# Patient Record
Sex: Female | Born: 1945 | Race: White | Hispanic: No | Marital: Married | Smoking: Former smoker
Health system: Southern US, Community
[De-identification: ages and names within clinical notes are randomized; demographics above are authoritative.]

## PROBLEM LIST (undated history)

## (undated) DIAGNOSIS — I10 Essential (primary) hypertension: Secondary | ICD-10-CM

## (undated) HISTORY — DX: Essential (primary) hypertension: I10

---

## 1995-04-10 HISTORY — PX: ABDOMINAL HYSTERECTOMY: SHX81

## 1998-11-15 ENCOUNTER — Other Ambulatory Visit: Admission: RE | Admit: 1998-11-15 | Discharge: 1998-11-15 | Payer: Self-pay | Admitting: Obstetrics and Gynecology

## 1998-12-01 ENCOUNTER — Ambulatory Visit (HOSPITAL_COMMUNITY): Admission: RE | Admit: 1998-12-01 | Discharge: 1998-12-01 | Payer: Self-pay | Admitting: Obstetrics and Gynecology

## 1998-12-01 ENCOUNTER — Encounter: Payer: Self-pay | Admitting: Obstetrics and Gynecology

## 1998-12-16 ENCOUNTER — Encounter (INDEPENDENT_AMBULATORY_CARE_PROVIDER_SITE_OTHER): Payer: Self-pay | Admitting: Specialist

## 1998-12-16 ENCOUNTER — Ambulatory Visit (HOSPITAL_COMMUNITY): Admission: RE | Admit: 1998-12-16 | Discharge: 1998-12-16 | Payer: Self-pay | Admitting: Gastroenterology

## 2002-06-29 ENCOUNTER — Encounter (INDEPENDENT_AMBULATORY_CARE_PROVIDER_SITE_OTHER): Payer: Self-pay

## 2002-06-29 ENCOUNTER — Ambulatory Visit (HOSPITAL_COMMUNITY): Admission: RE | Admit: 2002-06-29 | Discharge: 2002-06-29 | Payer: Self-pay | Admitting: Gastroenterology

## 2003-09-17 ENCOUNTER — Ambulatory Visit (HOSPITAL_COMMUNITY): Admission: RE | Admit: 2003-09-17 | Discharge: 2003-09-17 | Payer: Self-pay | Admitting: Family Medicine

## 2009-03-04 ENCOUNTER — Encounter: Admission: RE | Admit: 2009-03-04 | Discharge: 2009-03-04 | Payer: Self-pay | Admitting: Internal Medicine

## 2009-04-05 ENCOUNTER — Other Ambulatory Visit: Admission: RE | Admit: 2009-04-05 | Discharge: 2009-04-05 | Payer: Self-pay | Admitting: Obstetrics and Gynecology

## 2010-03-21 ENCOUNTER — Other Ambulatory Visit
Admission: RE | Admit: 2010-03-21 | Discharge: 2010-03-21 | Payer: Self-pay | Source: Home / Self Care | Admitting: Obstetrics and Gynecology

## 2010-08-25 NOTE — Op Note (Signed)
   NAMEJALAYSIA, Courtney Kaiser                      ACCOUNT NO.:  [id number]   MEDICAL RECORD NO.:  [id number]                   PATIENT TYPE:  AMB   LOCATION:  ENDO                                 FACILITY:  [facility]   PHYSICIAN:  John C. Madilyn Fireman, M.D.                 DATE OF BIRTH:  1945/04/22   DATE OF PROCEDURE:  06/29/2002  DATE OF DISCHARGE:                                 OPERATIVE REPORT   PROCEDURE:  Colonoscopy with polypectomy.   INDICATION FOR PROCEDURE:  Adenomatous colon polyps on initial colonoscopy  three years ago.   DESCRIPTION OF PROCEDURE:  The patient was placed in the left lateral  decubitus position and placed on the pulse monitor with continuous low-flow  oxygen delivered by nasal cannula.  She was sedated with 100 mcg IV fentanyl  and 9 mg IV Versed.  The Olympus video colonoscope was inserted into the  rectum and advanced to the cecum, confirmed by transillumination of  McBurney's point and visualization of the ileocecal valve and appendiceal  orifice.  The prep was excellent.  The cecum, ascending, transverse,  descending, and sigmoid colon all appeared normal with no masses, polyps,  diverticula, or other mucosal abnormalities.  The rectum revealed two small  sessile polyps 6-8 mm in diameter near the rectosigmoid junction, and these  were fulgurated by hot biopsy.  The remainder of the rectum appeared normal  down to the anus, where retroflexed view revealed small internal  hemorrhoids.  The colonoscope was then withdrawn and the patient returned to  the recovery room in stable condition.  She tolerated the procedure well,  and there were no immediate complications.   IMPRESSION:  1. Rectosigmoid colon polyps.  2. Small internal hemorrhoids.   PLAN:  Await histology, and will probably repeat colonoscopy in five years.                                               John C. Madilyn Fireman, M.D.    JCH/MEDQ  D:  06/29/2002  T:  06/29/2002  Job:  [id number]   cc:    Joycelyn Rua, M.D.  [address] [place]  [place] [address]  Fax: [phone]

## 2011-07-09 ENCOUNTER — Other Ambulatory Visit: Payer: Self-pay | Admitting: Advanced Practice Midwife

## 2011-07-09 DIAGNOSIS — Z1231 Encounter for screening mammogram for malignant neoplasm of breast: Secondary | ICD-10-CM

## 2011-07-16 ENCOUNTER — Ambulatory Visit
Admission: RE | Admit: 2011-07-16 | Discharge: 2011-07-16 | Disposition: A | Discharge status: Home / Self Care | Payer: BC Managed Care – PPO | Source: Ambulatory Visit | Attending: Advanced Practice Midwife | Admitting: Advanced Practice Midwife

## 2011-07-16 DIAGNOSIS — Z1231 Encounter for screening mammogram for malignant neoplasm of breast: Secondary | ICD-10-CM

## 2011-11-08 ENCOUNTER — Other Ambulatory Visit: Payer: Self-pay | Admitting: Family Medicine

## 2011-11-08 ENCOUNTER — Other Ambulatory Visit (HOSPITAL_COMMUNITY)
Admission: RE | Admit: 2011-11-08 | Discharge: 2011-11-08 | Disposition: A | Discharge status: Home / Self Care | Payer: BC Managed Care – PPO | Source: Ambulatory Visit | Attending: Family Medicine | Admitting: Family Medicine

## 2011-11-08 DIAGNOSIS — Z124 Encounter for screening for malignant neoplasm of cervix: Secondary | ICD-10-CM | POA: Insufficient documentation

## 2012-11-14 ENCOUNTER — Other Ambulatory Visit: Payer: Self-pay | Admitting: Family Medicine

## 2012-11-14 ENCOUNTER — Other Ambulatory Visit (HOSPITAL_COMMUNITY)
Admission: RE | Admit: 2012-11-14 | Discharge: 2012-11-14 | Disposition: A | Discharge status: Home / Self Care | Payer: BC Managed Care – PPO | Source: Ambulatory Visit | Attending: Family Medicine | Admitting: Family Medicine

## 2012-11-14 DIAGNOSIS — Z124 Encounter for screening for malignant neoplasm of cervix: Secondary | ICD-10-CM | POA: Insufficient documentation

## 2012-11-14 DIAGNOSIS — Z1231 Encounter for screening mammogram for malignant neoplasm of breast: Secondary | ICD-10-CM

## 2012-11-14 DIAGNOSIS — Z1151 Encounter for screening for human papillomavirus (HPV): Secondary | ICD-10-CM | POA: Insufficient documentation

## 2012-12-03 ENCOUNTER — Ambulatory Visit: Payer: BC Managed Care – PPO

## 2013-12-11 ENCOUNTER — Other Ambulatory Visit: Payer: Self-pay | Admitting: Family Medicine

## 2013-12-11 DIAGNOSIS — Z1231 Encounter for screening mammogram for malignant neoplasm of breast: Secondary | ICD-10-CM

## 2013-12-28 ENCOUNTER — Ambulatory Visit
Admission: RE | Admit: 2013-12-28 | Discharge: 2013-12-28 | Disposition: A | Discharge status: Home / Self Care | Payer: Medicare Other | Source: Ambulatory Visit | Attending: Family Medicine | Admitting: Family Medicine

## 2013-12-28 DIAGNOSIS — Z1231 Encounter for screening mammogram for malignant neoplasm of breast: Secondary | ICD-10-CM

## 2013-12-31 ENCOUNTER — Other Ambulatory Visit: Payer: Self-pay | Admitting: Family Medicine

## 2013-12-31 DIAGNOSIS — R928 Other abnormal and inconclusive findings on diagnostic imaging of breast: Secondary | ICD-10-CM

## 2014-01-05 ENCOUNTER — Ambulatory Visit
Admission: RE | Admit: 2014-01-05 | Discharge: 2014-01-05 | Disposition: A | Discharge status: Home / Self Care | Payer: Medicare Other | Source: Ambulatory Visit | Attending: Family Medicine | Admitting: Family Medicine

## 2014-01-05 DIAGNOSIS — R928 Other abnormal and inconclusive findings on diagnostic imaging of breast: Secondary | ICD-10-CM

## 2014-02-16 ENCOUNTER — Ambulatory Visit (INDEPENDENT_AMBULATORY_CARE_PROVIDER_SITE_OTHER): Payer: Medicare Other | Admitting: Cardiovascular Disease

## 2014-02-16 ENCOUNTER — Encounter: Payer: Self-pay | Admitting: Cardiovascular Disease

## 2014-02-16 VITALS — BP 114/86 | HR 70 | Ht 66.5 in | Wt 169.1 lb

## 2014-02-16 DIAGNOSIS — R079 Chest pain, unspecified: Secondary | ICD-10-CM

## 2014-02-16 DIAGNOSIS — R9431 Abnormal electrocardiogram [ECG] [EKG]: Secondary | ICD-10-CM

## 2014-02-16 DIAGNOSIS — R0789 Other chest pain: Secondary | ICD-10-CM | POA: Insufficient documentation

## 2014-02-16 NOTE — Assessment & Plan Note (Signed)
Repeat ECG today is normal.

## 2014-02-16 NOTE — Patient Instructions (Signed)
Your physician recommends that you continue on your current medications as directed. Please refer to the Current Medication list given to you today.  Your physician recommends that you schedule a follow-up appointment in: as needed with Dr. Nahser  

## 2014-02-16 NOTE — Progress Notes (Signed)
     Courtney DuffKaren Kaiser Date of Birth  1945-10-16       [facility]    [place] [facility] [address], [address]  [address], [address] [place], [place]  [id number]   [place]  [id number] [phone]     [phone]   Fax  [phone]     Fax [phone]  Problem List: 1. Hypertension  History of Present Illness:  Courtney Kaiser is a 68 year old female with a history of hypertension. She was initially seen at her medical doctor for conjunctivitis.  Marland Kitchen. She was noted have a mildly abnormal EKG.  She has been having some upper epigastric / gas pain.   She had some dizziness and diaphoresis and was concerned.    Resolved after an hour or so. She thinks that it was gas pain /indigestion.   occurrred just once.  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 for the past 3 years. She denies any chest pain with exertion.  Able to get her HR up to 145 and even achieved 170 for a short duration.     No current outpatient prescriptions on file prior to visit.   No current facility-administered medications on file prior to visit.    No Known Allergies  Past Medical History  Diagnosis Date  . Hypertension     Past Surgical History  Procedure Laterality Date  . Abdominal hysterectomy  97    History  Smoking status  . Former Smoker  Smokeless tobacco  . Not on file    History  Alcohol Use  . 0.0 oz/week  . 0 Not specified per week    Comment: OCCASSIONALLY     Family History  Problem Relation Age of Onset  . Hodgkin's lymphoma Mother   . Heart attack Maternal Grandfather     Reviw of Systems:  Reviewed in the HPI.  All other systems are negative.  Physical Exam: Blood pressure 114/86, pulse 70, height 5' 6.5" (1.689 m), weight 169 lb 1.9 oz (76.712 kg). Wt Readings from Last 3 Encounters:  02/16/14 169 lb 1.9 oz (76.712 kg)     General:  Well developed, well nourished, in no acute distress.  Head: Normocephalic, atraumatic, sclera non-icteric, mucus membranes are moist,   Neck: Supple. Carotids are 2 + without bruits. No JVD   Lungs: Clear   Heart: RR, normal S1S2  Abdomen: Soft, non-tender, non-distended with normal bowel sounds.  Msk:  Strength and tone are normal   Extremities: No clubbing or cyanosis. No edema.  Distal pedal pulses are 2+ and equal    Neuro: CN II - XII intact.  Alert and oriented X 3.   Psych:  Normal   ECG: Nov. 10, 2015:  NSR at 70. normal  Assessment / Plan:

## 2014-02-16 NOTE — Assessment & Plan Note (Signed)
Courtney BraunKaren presents with atypical CP - the smptoms sound like gas pain.  She works out at [employer] on a regular basis and has no CP or dyspnea. She has a normal ECG here today .  I suspect the abnormalities seen at her medical doctors office were due to lead placement.   I do not think I need to see her on a right basis but would be happy to see her in the future if she has any additional problems. I have encouraged her to  continued exercise on regular basis.

## 2015-02-08 ENCOUNTER — Other Ambulatory Visit: Payer: Self-pay | Admitting: Family Medicine

## 2015-02-08 DIAGNOSIS — N6320 Unspecified lump in the left breast, unspecified quadrant: Secondary | ICD-10-CM

## 2015-02-14 ENCOUNTER — Ambulatory Visit
Admission: RE | Admit: 2015-02-14 | Discharge: 2015-02-14 | Disposition: A | Discharge status: Home / Self Care | Payer: Medicare Other | Source: Ambulatory Visit | Attending: Family Medicine | Admitting: Family Medicine

## 2015-02-14 DIAGNOSIS — N6320 Unspecified lump in the left breast, unspecified quadrant: Secondary | ICD-10-CM

## 2015-02-21 ENCOUNTER — Other Ambulatory Visit (HOSPITAL_COMMUNITY)
Admission: RE | Admit: 2015-02-21 | Discharge: 2015-02-21 | Disposition: A | Discharge status: Home / Self Care | Payer: Medicare Other | Source: Ambulatory Visit | Attending: Family Medicine | Admitting: Family Medicine

## 2015-02-21 ENCOUNTER — Other Ambulatory Visit: Payer: Self-pay | Admitting: Family Medicine

## 2015-02-21 DIAGNOSIS — Z124 Encounter for screening for malignant neoplasm of cervix: Secondary | ICD-10-CM | POA: Diagnosis present

## 2015-02-21 DIAGNOSIS — Z1151 Encounter for screening for human papillomavirus (HPV): Secondary | ICD-10-CM | POA: Diagnosis present

## 2015-02-23 LAB — CYTOLOGY - PAP

## 2016-05-22 ENCOUNTER — Other Ambulatory Visit: Payer: Self-pay | Admitting: Family Medicine

## 2016-05-22 DIAGNOSIS — Z1231 Encounter for screening mammogram for malignant neoplasm of breast: Secondary | ICD-10-CM

## 2016-06-12 ENCOUNTER — Ambulatory Visit
Admission: RE | Admit: 2016-06-12 | Discharge: 2016-06-12 | Disposition: A | Discharge status: Home / Self Care | Payer: Medicare Other | Source: Ambulatory Visit | Attending: Family Medicine | Admitting: Family Medicine

## 2016-06-12 DIAGNOSIS — Z1231 Encounter for screening mammogram for malignant neoplasm of breast: Secondary | ICD-10-CM

## 2018-06-17 ENCOUNTER — Other Ambulatory Visit: Payer: Self-pay | Admitting: Family Medicine

## 2018-06-17 DIAGNOSIS — Z1231 Encounter for screening mammogram for malignant neoplasm of breast: Secondary | ICD-10-CM

## 2018-09-24 ENCOUNTER — Ambulatory Visit: Payer: Medicare Other

## 2019-06-19 ENCOUNTER — Other Ambulatory Visit: Payer: Self-pay | Admitting: Family Medicine

## 2019-06-19 DIAGNOSIS — Z1231 Encounter for screening mammogram for malignant neoplasm of breast: Secondary | ICD-10-CM

## 2019-07-20 ENCOUNTER — Other Ambulatory Visit: Payer: Self-pay

## 2019-07-20 ENCOUNTER — Ambulatory Visit
Admission: RE | Admit: 2019-07-20 | Discharge: 2019-07-20 | Disposition: A | Discharge status: Home / Self Care | Payer: Medicare PPO | Source: Ambulatory Visit | Attending: Family Medicine | Admitting: Family Medicine

## 2019-07-20 DIAGNOSIS — Z1231 Encounter for screening mammogram for malignant neoplasm of breast: Secondary | ICD-10-CM

## 2019-11-25 DIAGNOSIS — H524 Presbyopia: Secondary | ICD-10-CM | POA: Diagnosis not present

## 2019-11-25 DIAGNOSIS — H2513 Age-related nuclear cataract, bilateral: Secondary | ICD-10-CM | POA: Diagnosis not present

## 2019-12-25 DIAGNOSIS — Z03818 Encounter for observation for suspected exposure to other biological agents ruled out: Secondary | ICD-10-CM | POA: Diagnosis not present

## 2019-12-25 DIAGNOSIS — Z20822 Contact with and (suspected) exposure to covid-19: Secondary | ICD-10-CM | POA: Diagnosis not present

## 2020-04-27 DIAGNOSIS — B349 Viral infection, unspecified: Secondary | ICD-10-CM | POA: Diagnosis not present

## 2020-04-28 DIAGNOSIS — B349 Viral infection, unspecified: Secondary | ICD-10-CM | POA: Diagnosis not present

## 2020-07-05 DIAGNOSIS — I10 Essential (primary) hypertension: Secondary | ICD-10-CM | POA: Diagnosis not present

## 2020-07-05 DIAGNOSIS — Z8541 Personal history of malignant neoplasm of cervix uteri: Secondary | ICD-10-CM | POA: Diagnosis not present

## 2020-07-05 DIAGNOSIS — Z0000 Encounter for general adult medical examination without abnormal findings: Secondary | ICD-10-CM | POA: Diagnosis not present

## 2020-07-05 DIAGNOSIS — R7301 Impaired fasting glucose: Secondary | ICD-10-CM | POA: Diagnosis not present

## 2020-07-05 DIAGNOSIS — E559 Vitamin D deficiency, unspecified: Secondary | ICD-10-CM | POA: Diagnosis not present

## 2020-07-05 DIAGNOSIS — E782 Mixed hyperlipidemia: Secondary | ICD-10-CM | POA: Diagnosis not present

## 2020-11-01 DIAGNOSIS — H524 Presbyopia: Secondary | ICD-10-CM | POA: Diagnosis not present

## 2020-11-01 DIAGNOSIS — H2513 Age-related nuclear cataract, bilateral: Secondary | ICD-10-CM | POA: Diagnosis not present

## 2020-12-13 DIAGNOSIS — Z809 Family history of malignant neoplasm, unspecified: Secondary | ICD-10-CM | POA: Diagnosis not present

## 2020-12-13 DIAGNOSIS — Z87891 Personal history of nicotine dependence: Secondary | ICD-10-CM | POA: Diagnosis not present

## 2020-12-13 DIAGNOSIS — Z7722 Contact with and (suspected) exposure to environmental tobacco smoke (acute) (chronic): Secondary | ICD-10-CM | POA: Diagnosis not present

## 2020-12-13 DIAGNOSIS — R32 Unspecified urinary incontinence: Secondary | ICD-10-CM | POA: Diagnosis not present

## 2020-12-13 DIAGNOSIS — K5900 Constipation, unspecified: Secondary | ICD-10-CM | POA: Diagnosis not present

## 2020-12-13 DIAGNOSIS — H04129 Dry eye syndrome of unspecified lacrimal gland: Secondary | ICD-10-CM | POA: Diagnosis not present

## 2020-12-13 DIAGNOSIS — H269 Unspecified cataract: Secondary | ICD-10-CM | POA: Diagnosis not present

## 2020-12-13 DIAGNOSIS — I10 Essential (primary) hypertension: Secondary | ICD-10-CM | POA: Diagnosis not present

## 2021-05-25 DIAGNOSIS — H6641 Suppurative otitis media, unspecified, right ear: Secondary | ICD-10-CM | POA: Diagnosis not present

## 2021-05-25 DIAGNOSIS — H6121 Impacted cerumen, right ear: Secondary | ICD-10-CM | POA: Diagnosis not present

## 2021-05-25 DIAGNOSIS — Z9089 Acquired absence of other organs: Secondary | ICD-10-CM | POA: Diagnosis not present

## 2021-05-25 DIAGNOSIS — Z9622 Myringotomy tube(s) status: Secondary | ICD-10-CM | POA: Diagnosis not present

## 2021-06-21 DIAGNOSIS — Z809 Family history of malignant neoplasm, unspecified: Secondary | ICD-10-CM | POA: Diagnosis not present

## 2021-06-21 DIAGNOSIS — Z87891 Personal history of nicotine dependence: Secondary | ICD-10-CM | POA: Diagnosis not present

## 2021-06-21 DIAGNOSIS — I10 Essential (primary) hypertension: Secondary | ICD-10-CM | POA: Diagnosis not present

## 2021-06-21 DIAGNOSIS — Z823 Family history of stroke: Secondary | ICD-10-CM | POA: Diagnosis not present

## 2021-06-21 DIAGNOSIS — Z8249 Family history of ischemic heart disease and other diseases of the circulatory system: Secondary | ICD-10-CM | POA: Diagnosis not present

## 2021-07-13 DIAGNOSIS — Z0000 Encounter for general adult medical examination without abnormal findings: Secondary | ICD-10-CM | POA: Diagnosis not present

## 2021-07-13 DIAGNOSIS — R7301 Impaired fasting glucose: Secondary | ICD-10-CM | POA: Diagnosis not present

## 2021-07-13 DIAGNOSIS — Z8541 Personal history of malignant neoplasm of cervix uteri: Secondary | ICD-10-CM | POA: Diagnosis not present

## 2021-07-13 DIAGNOSIS — Z23 Encounter for immunization: Secondary | ICD-10-CM | POA: Diagnosis not present

## 2021-07-13 DIAGNOSIS — E782 Mixed hyperlipidemia: Secondary | ICD-10-CM | POA: Diagnosis not present

## 2021-07-13 DIAGNOSIS — I10 Essential (primary) hypertension: Secondary | ICD-10-CM | POA: Diagnosis not present

## 2021-07-13 DIAGNOSIS — L309 Dermatitis, unspecified: Secondary | ICD-10-CM | POA: Diagnosis not present

## 2021-07-13 DIAGNOSIS — E559 Vitamin D deficiency, unspecified: Secondary | ICD-10-CM | POA: Diagnosis not present

## 2021-07-14 ENCOUNTER — Other Ambulatory Visit: Payer: Self-pay | Admitting: Family Medicine

## 2021-07-14 DIAGNOSIS — Z1231 Encounter for screening mammogram for malignant neoplasm of breast: Secondary | ICD-10-CM

## 2021-08-14 ENCOUNTER — Ambulatory Visit: Payer: Medicare PPO

## 2021-08-22 ENCOUNTER — Ambulatory Visit
Admission: RE | Admit: 2021-08-22 | Discharge: 2021-08-22 | Disposition: A | Discharge status: Home / Self Care | Payer: Medicare PPO | Source: Ambulatory Visit | Attending: Family Medicine | Admitting: Family Medicine

## 2021-08-22 DIAGNOSIS — Z1231 Encounter for screening mammogram for malignant neoplasm of breast: Secondary | ICD-10-CM | POA: Diagnosis not present

## 2021-10-26 DIAGNOSIS — H2513 Age-related nuclear cataract, bilateral: Secondary | ICD-10-CM | POA: Diagnosis not present

## 2021-10-26 DIAGNOSIS — H524 Presbyopia: Secondary | ICD-10-CM | POA: Diagnosis not present

## 2021-11-16 DIAGNOSIS — H6121 Impacted cerumen, right ear: Secondary | ICD-10-CM | POA: Diagnosis not present

## 2021-11-16 DIAGNOSIS — Z9622 Myringotomy tube(s) status: Secondary | ICD-10-CM | POA: Diagnosis not present

## 2022-01-23 DIAGNOSIS — J019 Acute sinusitis, unspecified: Secondary | ICD-10-CM | POA: Diagnosis not present

## 2022-03-10 DIAGNOSIS — J00 Acute nasopharyngitis [common cold]: Secondary | ICD-10-CM | POA: Diagnosis not present

## 2022-03-16 DIAGNOSIS — R2242 Localized swelling, mass and lump, left lower limb: Secondary | ICD-10-CM | POA: Diagnosis not present

## 2022-03-28 DIAGNOSIS — M2012 Hallux valgus (acquired), left foot: Secondary | ICD-10-CM | POA: Diagnosis not present

## 2022-03-28 DIAGNOSIS — M79672 Pain in left foot: Secondary | ICD-10-CM | POA: Diagnosis not present

## 2022-04-25 DIAGNOSIS — M67472 Ganglion, left ankle and foot: Secondary | ICD-10-CM | POA: Diagnosis not present

## 2022-04-25 DIAGNOSIS — M79672 Pain in left foot: Secondary | ICD-10-CM | POA: Diagnosis not present

## 2022-05-10 DIAGNOSIS — D1801 Hemangioma of skin and subcutaneous tissue: Secondary | ICD-10-CM | POA: Diagnosis not present

## 2022-05-10 DIAGNOSIS — M67472 Ganglion, left ankle and foot: Secondary | ICD-10-CM | POA: Diagnosis not present

## 2022-05-10 DIAGNOSIS — D1809 Hemangioma of other sites: Secondary | ICD-10-CM | POA: Diagnosis not present

## 2022-05-10 DIAGNOSIS — M799 Soft tissue disorder, unspecified: Secondary | ICD-10-CM | POA: Diagnosis not present

## 2022-06-01 DIAGNOSIS — Z9089 Acquired absence of other organs: Secondary | ICD-10-CM | POA: Diagnosis not present

## 2022-06-01 DIAGNOSIS — H9193 Unspecified hearing loss, bilateral: Secondary | ICD-10-CM | POA: Diagnosis not present

## 2022-06-01 DIAGNOSIS — J329 Chronic sinusitis, unspecified: Secondary | ICD-10-CM | POA: Diagnosis not present

## 2022-06-05 DIAGNOSIS — J342 Deviated nasal septum: Secondary | ICD-10-CM | POA: Diagnosis not present

## 2022-06-05 DIAGNOSIS — H9193 Unspecified hearing loss, bilateral: Secondary | ICD-10-CM | POA: Diagnosis not present

## 2022-06-05 DIAGNOSIS — J329 Chronic sinusitis, unspecified: Secondary | ICD-10-CM | POA: Diagnosis not present

## 2022-06-05 DIAGNOSIS — Z9089 Acquired absence of other organs: Secondary | ICD-10-CM | POA: Diagnosis not present

## 2022-07-23 ENCOUNTER — Other Ambulatory Visit: Payer: Self-pay | Admitting: Family Medicine

## 2022-07-23 DIAGNOSIS — I10 Essential (primary) hypertension: Secondary | ICD-10-CM | POA: Diagnosis not present

## 2022-07-23 DIAGNOSIS — Z1231 Encounter for screening mammogram for malignant neoplasm of breast: Secondary | ICD-10-CM

## 2022-07-23 DIAGNOSIS — Z8541 Personal history of malignant neoplasm of cervix uteri: Secondary | ICD-10-CM | POA: Diagnosis not present

## 2022-07-23 DIAGNOSIS — E782 Mixed hyperlipidemia: Secondary | ICD-10-CM | POA: Diagnosis not present

## 2022-07-23 DIAGNOSIS — Z0000 Encounter for general adult medical examination without abnormal findings: Secondary | ICD-10-CM | POA: Diagnosis not present

## 2022-07-23 DIAGNOSIS — R7301 Impaired fasting glucose: Secondary | ICD-10-CM | POA: Diagnosis not present

## 2022-07-23 DIAGNOSIS — E559 Vitamin D deficiency, unspecified: Secondary | ICD-10-CM | POA: Diagnosis not present

## 2022-07-23 DIAGNOSIS — J309 Allergic rhinitis, unspecified: Secondary | ICD-10-CM | POA: Diagnosis not present

## 2022-07-23 DIAGNOSIS — Z9181 History of falling: Secondary | ICD-10-CM | POA: Diagnosis not present

## 2022-07-25 ENCOUNTER — Other Ambulatory Visit: Payer: Self-pay | Admitting: Family Medicine

## 2022-07-25 DIAGNOSIS — E2839 Other primary ovarian failure: Secondary | ICD-10-CM

## 2022-07-31 ENCOUNTER — Ambulatory Visit: Payer: Medicare PPO | Admitting: Diagnostic Neuroimaging

## 2022-08-01 DIAGNOSIS — H2513 Age-related nuclear cataract, bilateral: Secondary | ICD-10-CM | POA: Diagnosis not present

## 2022-08-01 DIAGNOSIS — H524 Presbyopia: Secondary | ICD-10-CM | POA: Diagnosis not present

## 2022-08-28 DIAGNOSIS — H25812 Combined forms of age-related cataract, left eye: Secondary | ICD-10-CM | POA: Diagnosis not present

## 2022-08-29 ENCOUNTER — Ambulatory Visit
Admission: RE | Admit: 2022-08-29 | Discharge: 2022-08-29 | Disposition: A | Discharge status: Home / Self Care | Payer: Medicare PPO | Source: Ambulatory Visit | Attending: Family Medicine | Admitting: Family Medicine

## 2022-08-29 DIAGNOSIS — Z1231 Encounter for screening mammogram for malignant neoplasm of breast: Secondary | ICD-10-CM

## 2022-09-13 DIAGNOSIS — H25812 Combined forms of age-related cataract, left eye: Secondary | ICD-10-CM | POA: Diagnosis not present

## 2022-09-25 DIAGNOSIS — R0982 Postnasal drip: Secondary | ICD-10-CM | POA: Diagnosis not present

## 2022-09-25 DIAGNOSIS — M1990 Unspecified osteoarthritis, unspecified site: Secondary | ICD-10-CM | POA: Diagnosis not present

## 2022-09-25 DIAGNOSIS — I10 Essential (primary) hypertension: Secondary | ICD-10-CM | POA: Diagnosis not present

## 2022-09-25 DIAGNOSIS — Z87891 Personal history of nicotine dependence: Secondary | ICD-10-CM | POA: Diagnosis not present

## 2022-09-25 DIAGNOSIS — J309 Allergic rhinitis, unspecified: Secondary | ICD-10-CM | POA: Diagnosis not present

## 2022-09-25 DIAGNOSIS — R32 Unspecified urinary incontinence: Secondary | ICD-10-CM | POA: Diagnosis not present

## 2022-09-25 DIAGNOSIS — Z8249 Family history of ischemic heart disease and other diseases of the circulatory system: Secondary | ICD-10-CM | POA: Diagnosis not present

## 2022-09-25 DIAGNOSIS — E785 Hyperlipidemia, unspecified: Secondary | ICD-10-CM | POA: Diagnosis not present

## 2022-09-25 DIAGNOSIS — Z823 Family history of stroke: Secondary | ICD-10-CM | POA: Diagnosis not present

## 2023-02-12 ENCOUNTER — Ambulatory Visit
Admission: RE | Admit: 2023-02-12 | Discharge: 2023-02-12 | Disposition: A | Discharge status: Home / Self Care | Payer: Medicare PPO | Source: Ambulatory Visit | Attending: Family Medicine | Admitting: Family Medicine

## 2023-02-12 DIAGNOSIS — E2839 Other primary ovarian failure: Secondary | ICD-10-CM | POA: Diagnosis not present

## 2023-02-12 DIAGNOSIS — N958 Other specified menopausal and perimenopausal disorders: Secondary | ICD-10-CM | POA: Diagnosis not present

## 2023-02-12 DIAGNOSIS — Z90722 Acquired absence of ovaries, bilateral: Secondary | ICD-10-CM | POA: Diagnosis not present

## 2023-05-23 DIAGNOSIS — D1801 Hemangioma of skin and subcutaneous tissue: Secondary | ICD-10-CM | POA: Diagnosis not present

## 2023-05-23 DIAGNOSIS — M79671 Pain in right foot: Secondary | ICD-10-CM | POA: Diagnosis not present

## 2023-05-23 DIAGNOSIS — M67471 Ganglion, right ankle and foot: Secondary | ICD-10-CM | POA: Diagnosis not present

## 2023-06-13 DIAGNOSIS — G8918 Other acute postprocedural pain: Secondary | ICD-10-CM | POA: Diagnosis not present

## 2023-06-13 DIAGNOSIS — M67471 Ganglion, right ankle and foot: Secondary | ICD-10-CM | POA: Diagnosis not present

## 2023-06-13 DIAGNOSIS — I82811 Embolism and thrombosis of superficial veins of right lower extremities: Secondary | ICD-10-CM | POA: Diagnosis not present

## 2023-06-13 DIAGNOSIS — D1801 Hemangioma of skin and subcutaneous tissue: Secondary | ICD-10-CM | POA: Diagnosis not present

## 2023-08-01 DIAGNOSIS — Z0000 Encounter for general adult medical examination without abnormal findings: Secondary | ICD-10-CM | POA: Diagnosis not present

## 2023-08-01 DIAGNOSIS — R7301 Impaired fasting glucose: Secondary | ICD-10-CM | POA: Diagnosis not present

## 2023-08-01 DIAGNOSIS — F330 Major depressive disorder, recurrent, mild: Secondary | ICD-10-CM | POA: Diagnosis not present

## 2023-08-01 DIAGNOSIS — I10 Essential (primary) hypertension: Secondary | ICD-10-CM | POA: Diagnosis not present

## 2023-08-01 DIAGNOSIS — E559 Vitamin D deficiency, unspecified: Secondary | ICD-10-CM | POA: Diagnosis not present

## 2023-08-01 DIAGNOSIS — Z1331 Encounter for screening for depression: Secondary | ICD-10-CM | POA: Diagnosis not present

## 2023-08-01 DIAGNOSIS — Z8541 Personal history of malignant neoplasm of cervix uteri: Secondary | ICD-10-CM | POA: Diagnosis not present

## 2023-08-01 DIAGNOSIS — E782 Mixed hyperlipidemia: Secondary | ICD-10-CM | POA: Diagnosis not present

## 2023-08-01 DIAGNOSIS — E669 Obesity, unspecified: Secondary | ICD-10-CM | POA: Diagnosis not present

## 2023-08-06 DIAGNOSIS — H26492 Other secondary cataract, left eye: Secondary | ICD-10-CM | POA: Diagnosis not present

## 2023-08-27 DIAGNOSIS — K5730 Diverticulosis of large intestine without perforation or abscess without bleeding: Secondary | ICD-10-CM | POA: Diagnosis not present

## 2023-08-27 DIAGNOSIS — K5520 Angiodysplasia of colon without hemorrhage: Secondary | ICD-10-CM | POA: Diagnosis not present

## 2023-08-27 DIAGNOSIS — Z09 Encounter for follow-up examination after completed treatment for conditions other than malignant neoplasm: Secondary | ICD-10-CM | POA: Diagnosis not present

## 2023-08-27 DIAGNOSIS — K635 Polyp of colon: Secondary | ICD-10-CM | POA: Diagnosis not present

## 2023-08-27 DIAGNOSIS — Z860101 Personal history of adenomatous and serrated colon polyps: Secondary | ICD-10-CM | POA: Diagnosis not present

## 2023-08-27 DIAGNOSIS — D122 Benign neoplasm of ascending colon: Secondary | ICD-10-CM | POA: Diagnosis not present

## 2023-08-29 DIAGNOSIS — K635 Polyp of colon: Secondary | ICD-10-CM | POA: Diagnosis not present

## 2023-08-29 DIAGNOSIS — D122 Benign neoplasm of ascending colon: Secondary | ICD-10-CM | POA: Diagnosis not present

## 2023-09-02 IMAGING — MG MM DIGITAL SCREENING BILAT W/ TOMO AND CAD
8 series · 8 of 24 positions shown · non-contrast
Comparison: Previous exam(s).

CLINICAL DATA: Screening.

EXAM:
DIGITAL SCREENING BILATERAL MAMMOGRAM WITH TOMOSYNTHESIS AND CAD
TECHNIQUE: Bilateral screening digital craniocaudal and mediolateral oblique
mammograms were obtained. Bilateral screening digital breast
tomosynthesis was performed. The images were evaluated with
computer-aided detection.

[R MLO synth-2D]
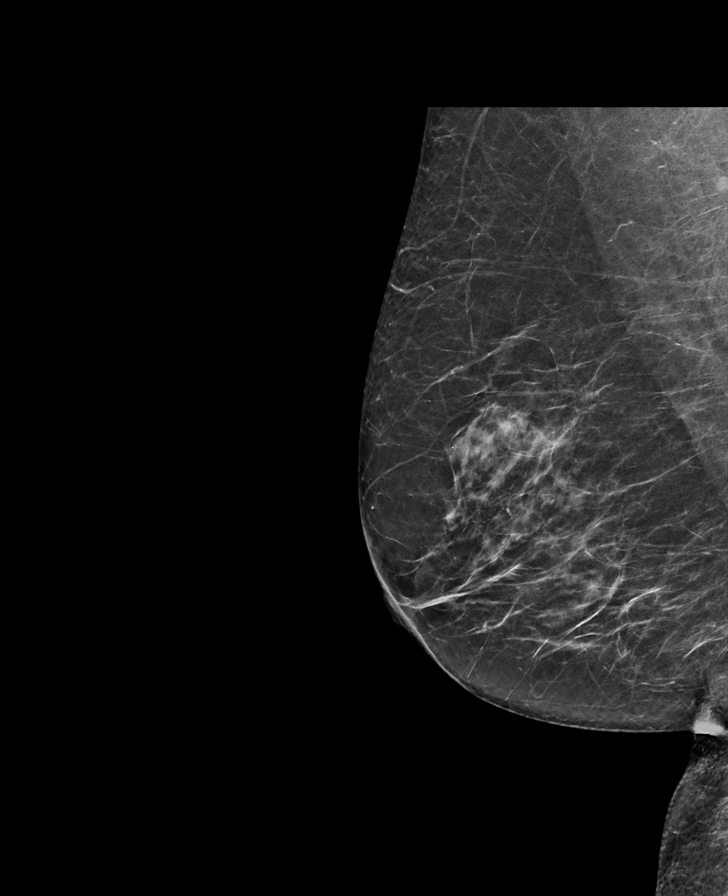

[L MLO synth-2D]
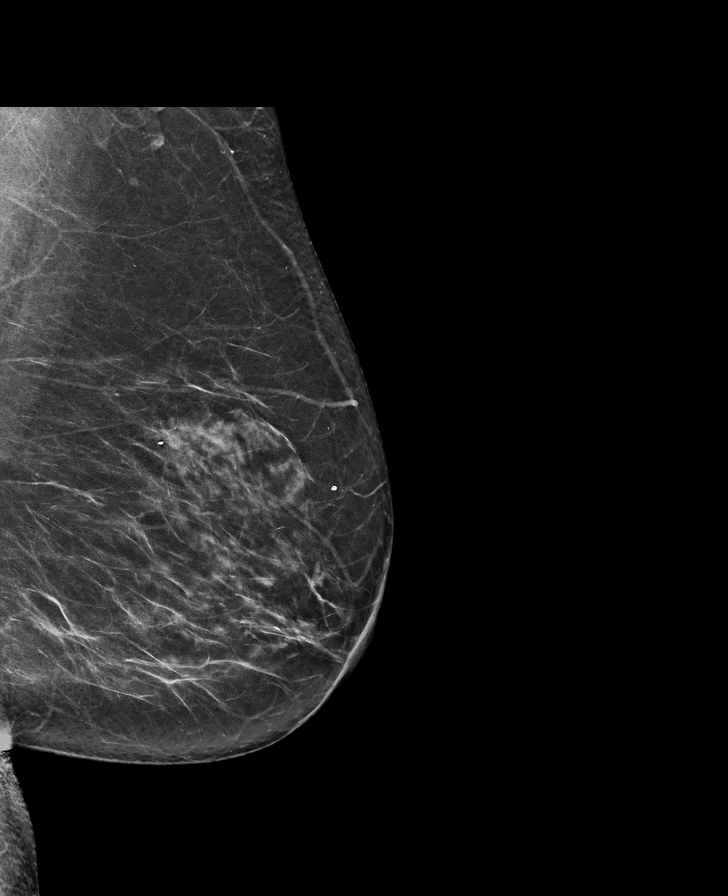

[L CC synth-2D]
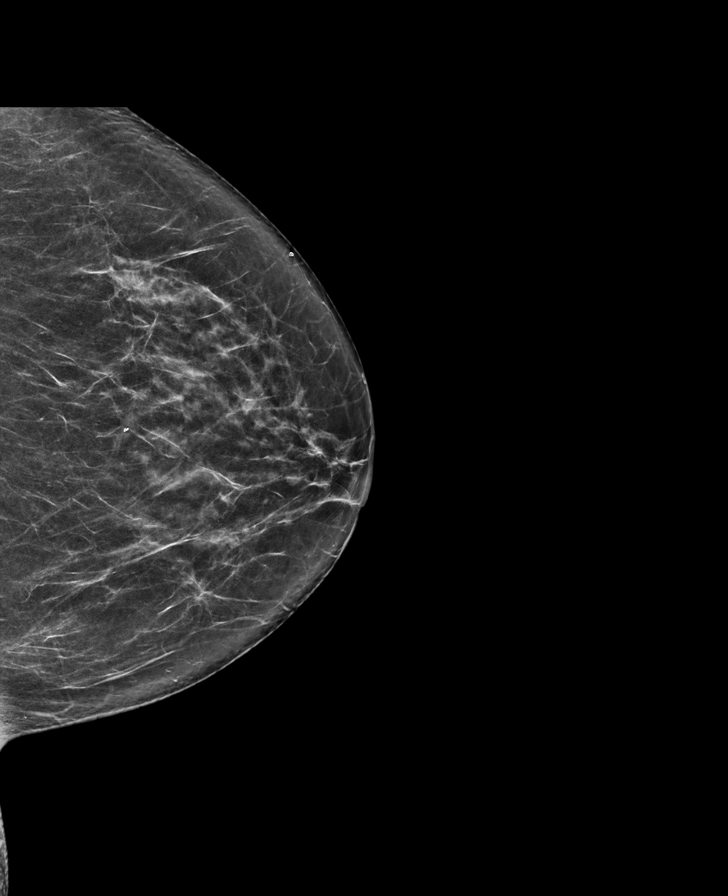

[R CC synth-2D]
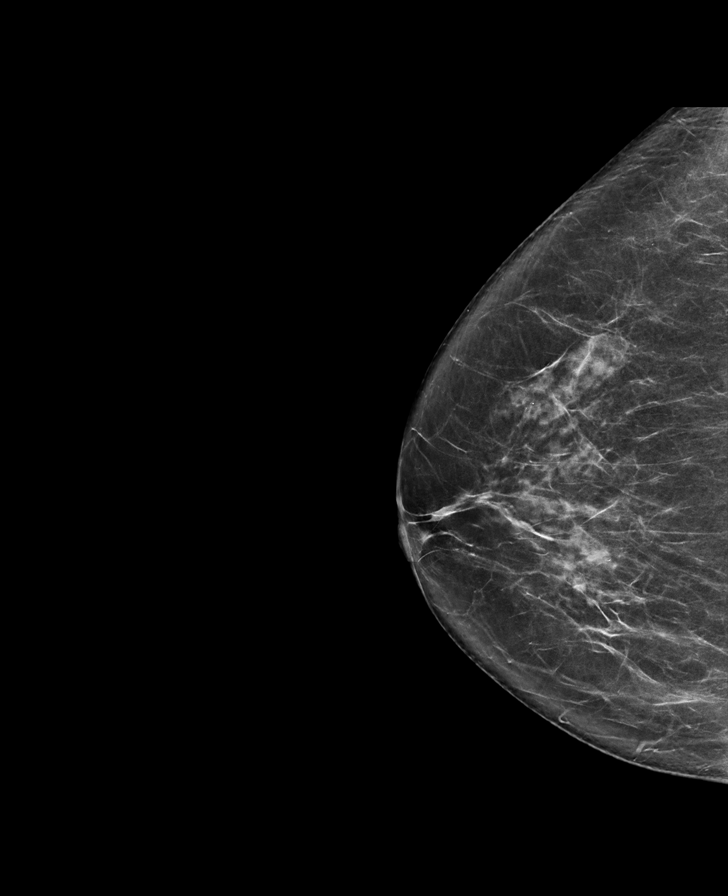

[L MLO tomo · tomo slice 36/71.0]
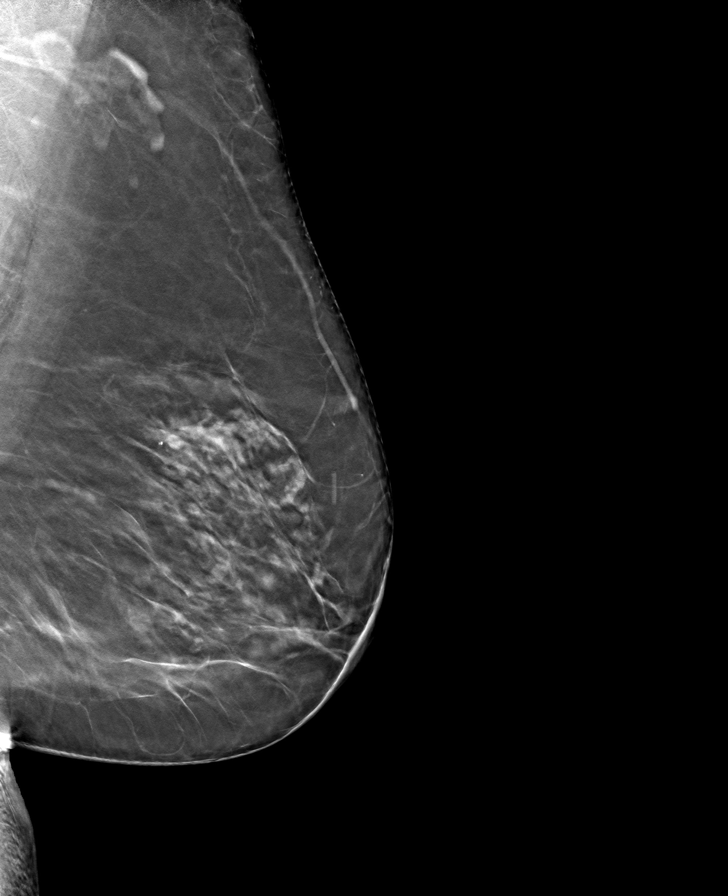

[R CC tomo · tomo slice 37/73.0]
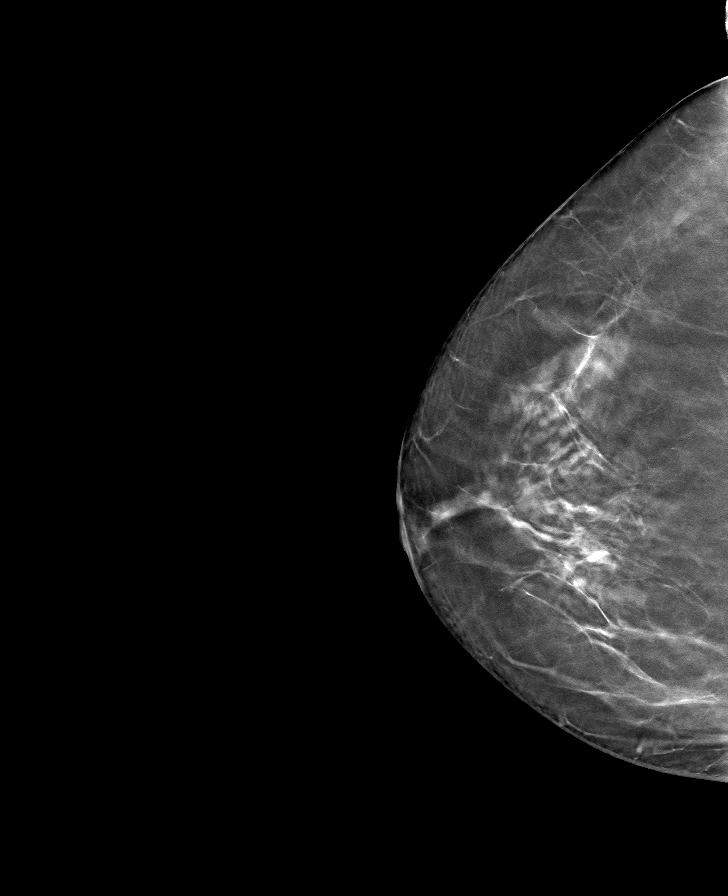

[R MLO tomo · tomo slice 34/67.0]
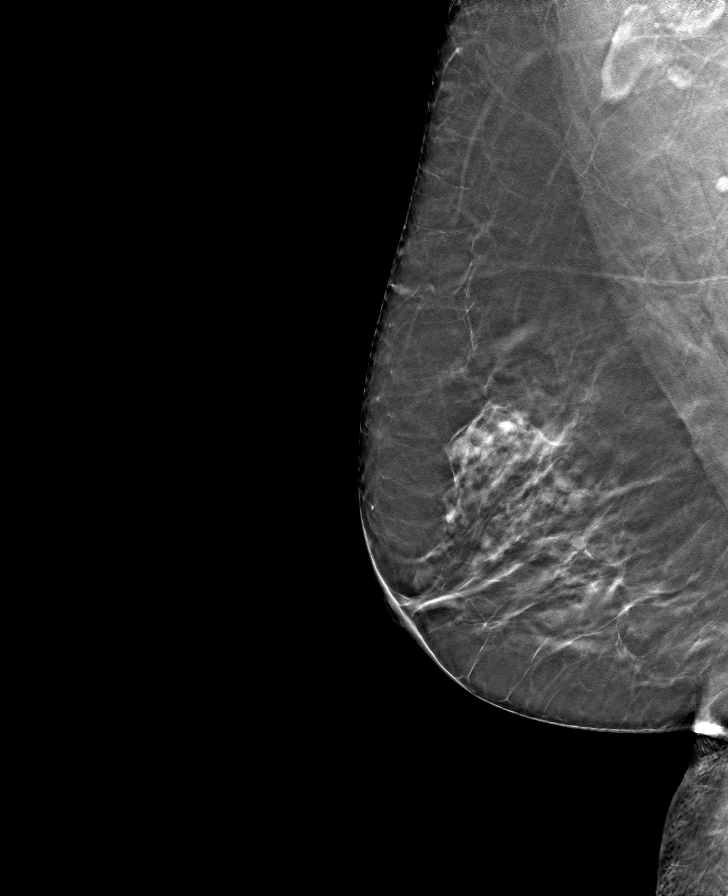

[L CC tomo · tomo slice 35/70.0]
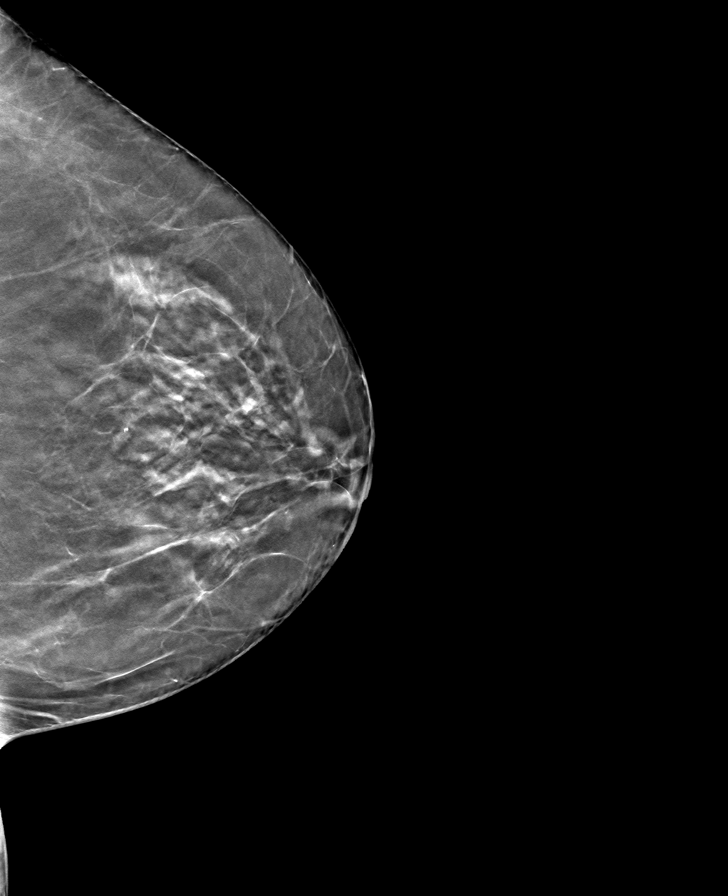

[8 of 24 positions shown; findings below may reference images not displayed]

ACR Breast Density Category b: There are scattered areas of
fibroglandular density.
FINDINGS: There are no findings suspicious for malignancy.
IMPRESSION: No mammographic evidence of malignancy. A result letter of this
screening mammogram will be mailed directly to the patient.

RECOMMENDATION:
Screening mammogram in one year. (Code:51-O-LD2)

BI-RADS CATEGORY  1: Negative.

## 2023-10-03 ENCOUNTER — Other Ambulatory Visit: Payer: Self-pay | Admitting: Family Medicine

## 2023-10-03 DIAGNOSIS — Z1231 Encounter for screening mammogram for malignant neoplasm of breast: Secondary | ICD-10-CM

## 2023-10-08 DIAGNOSIS — M79606 Pain in leg, unspecified: Secondary | ICD-10-CM | POA: Diagnosis not present

## 2023-10-14 ENCOUNTER — Ambulatory Visit
Admission: RE | Admit: 2023-10-14 | Discharge: 2023-10-14 | Disposition: A | Discharge status: Home / Self Care | Source: Ambulatory Visit | Attending: Family Medicine | Admitting: Family Medicine

## 2023-10-14 DIAGNOSIS — Z1231 Encounter for screening mammogram for malignant neoplasm of breast: Secondary | ICD-10-CM

## 2024-01-29 DIAGNOSIS — Z833 Family history of diabetes mellitus: Secondary | ICD-10-CM | POA: Diagnosis not present

## 2024-01-29 DIAGNOSIS — I10 Essential (primary) hypertension: Secondary | ICD-10-CM | POA: Diagnosis not present

## 2024-01-29 DIAGNOSIS — M1990 Unspecified osteoarthritis, unspecified site: Secondary | ICD-10-CM | POA: Diagnosis not present

## 2024-01-29 DIAGNOSIS — J309 Allergic rhinitis, unspecified: Secondary | ICD-10-CM | POA: Diagnosis not present

## 2024-01-29 DIAGNOSIS — F329 Major depressive disorder, single episode, unspecified: Secondary | ICD-10-CM | POA: Diagnosis not present

## 2024-01-29 DIAGNOSIS — Z8249 Family history of ischemic heart disease and other diseases of the circulatory system: Secondary | ICD-10-CM | POA: Diagnosis not present

## 2024-01-29 DIAGNOSIS — M2010 Hallux valgus (acquired), unspecified foot: Secondary | ICD-10-CM | POA: Diagnosis not present

## 2024-01-29 DIAGNOSIS — E669 Obesity, unspecified: Secondary | ICD-10-CM | POA: Diagnosis not present

## 2024-01-29 DIAGNOSIS — E785 Hyperlipidemia, unspecified: Secondary | ICD-10-CM | POA: Diagnosis not present
